# Patient Record
Sex: Male | Born: 1935 | Race: White | Hispanic: No | Marital: Married
Health system: Southern US, Community
[De-identification: ages and names within clinical notes are randomized; demographics above are authoritative.]

---

## 2004-06-23 ENCOUNTER — Inpatient Hospital Stay (HOSPITAL_COMMUNITY): Admission: EM | Admit: 2004-06-23 | Discharge: 2004-06-25 | Payer: Self-pay | Admitting: Emergency Medicine

## 2004-08-02 ENCOUNTER — Inpatient Hospital Stay (HOSPITAL_COMMUNITY): Admission: AD | Admit: 2004-08-02 | Discharge: 2004-08-16 | Payer: Self-pay | Admitting: Cardiology

## 2004-10-15 ENCOUNTER — Inpatient Hospital Stay (HOSPITAL_COMMUNITY): Admission: EM | Admit: 2004-10-15 | Discharge: 2004-10-16 | Payer: Self-pay | Admitting: Emergency Medicine

## 2006-07-31 IMAGING — CR DG CHEST 1V PORT
1 series · 1 of 1 positions shown · non-contrast
Comparison: 10/14/2004.

CLINICAL DATA: Marked shortness of breath.

PORTABLE CHEST - 1 VIEW

[view not recorded]
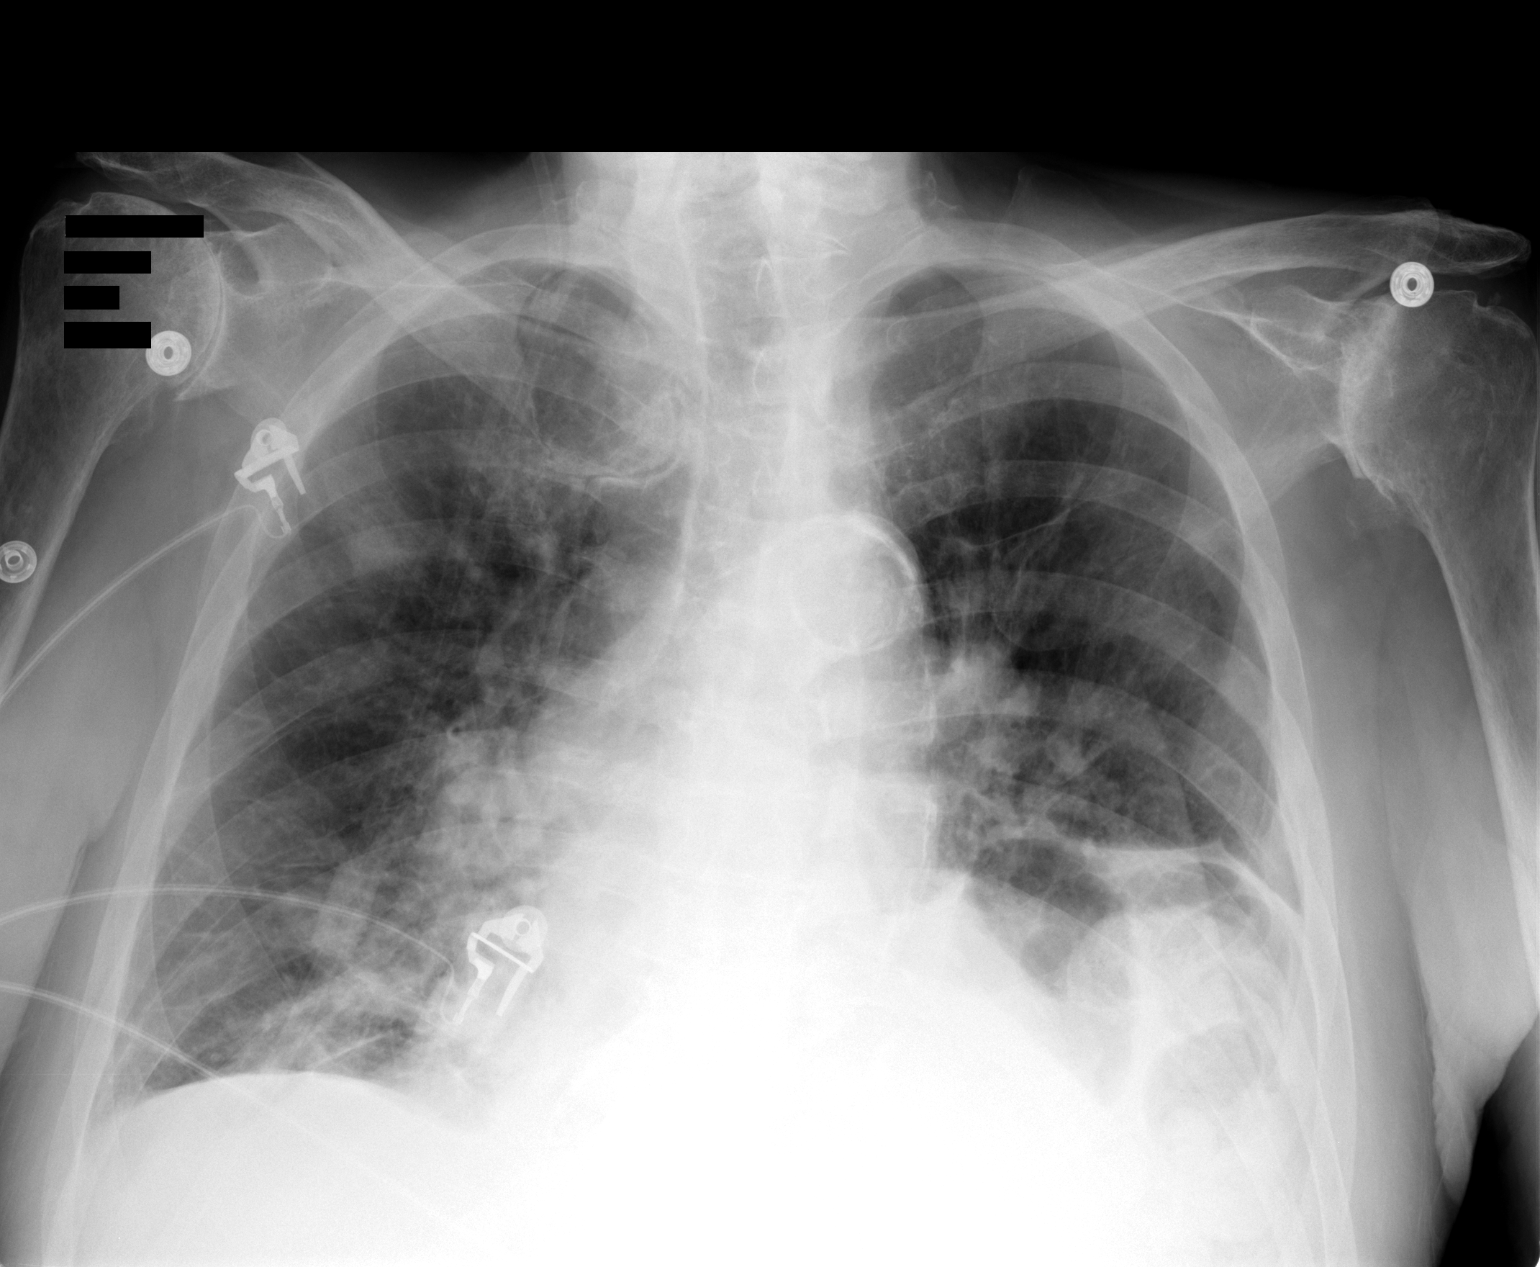

[1 of 1 positions shown; findings below may reference images not displayed]

FINDINGS: Interval mild patchy opacity in the right upper lobe and interval
mild atelectasis at the right lung base. Improved atelectasis at the medial left
lung base. Stable elevation of the left hemidiaphragm and enlargement of the
cardiac silhouette. Progressive diffuse peribronchial thickening. Marked
bilateral shoulder degenerative changes, degenerative changes at the right
sternomanubrial joint and diffuse osteopenia.  

IMPRESSION

1. Interval small amount of probable pneumonia in the right upper lobe.

2. Interval mild right basilar atelectasis.

3. Mildly improved left basilar atelectasis. 

4. Progressive bronchitic changes.

5. Stable cardiomegaly.

## 2013-10-11 ENCOUNTER — Telehealth: Payer: Self-pay | Admitting: Family Medicine

## 2013-10-11 NOTE — Telephone Encounter (Signed)
error
